# Patient Record
Sex: Male | Born: 1975 | Race: Black or African American | Hispanic: No | Marital: Married | Smoking: Current every day smoker
Health system: Southern US, Community
[De-identification: ages and names within clinical notes are randomized; demographics above are authoritative.]

---

## 2004-05-01 ENCOUNTER — Emergency Department (HOSPITAL_COMMUNITY): Admission: EM | Admit: 2004-05-01 | Discharge: 2004-05-02 | Payer: Self-pay | Admitting: *Deleted

## 2005-04-13 IMAGING — CR DG SHOULDER 2+V*R*
3 series · 3 of 3 positions shown · non-contrast
Comparison: none

CLINICAL DATA: Injury. 
 RIGHT SHOULDER TWO VIEWS ([DATE] HOURS)

[view not recorded (1 of 3)]
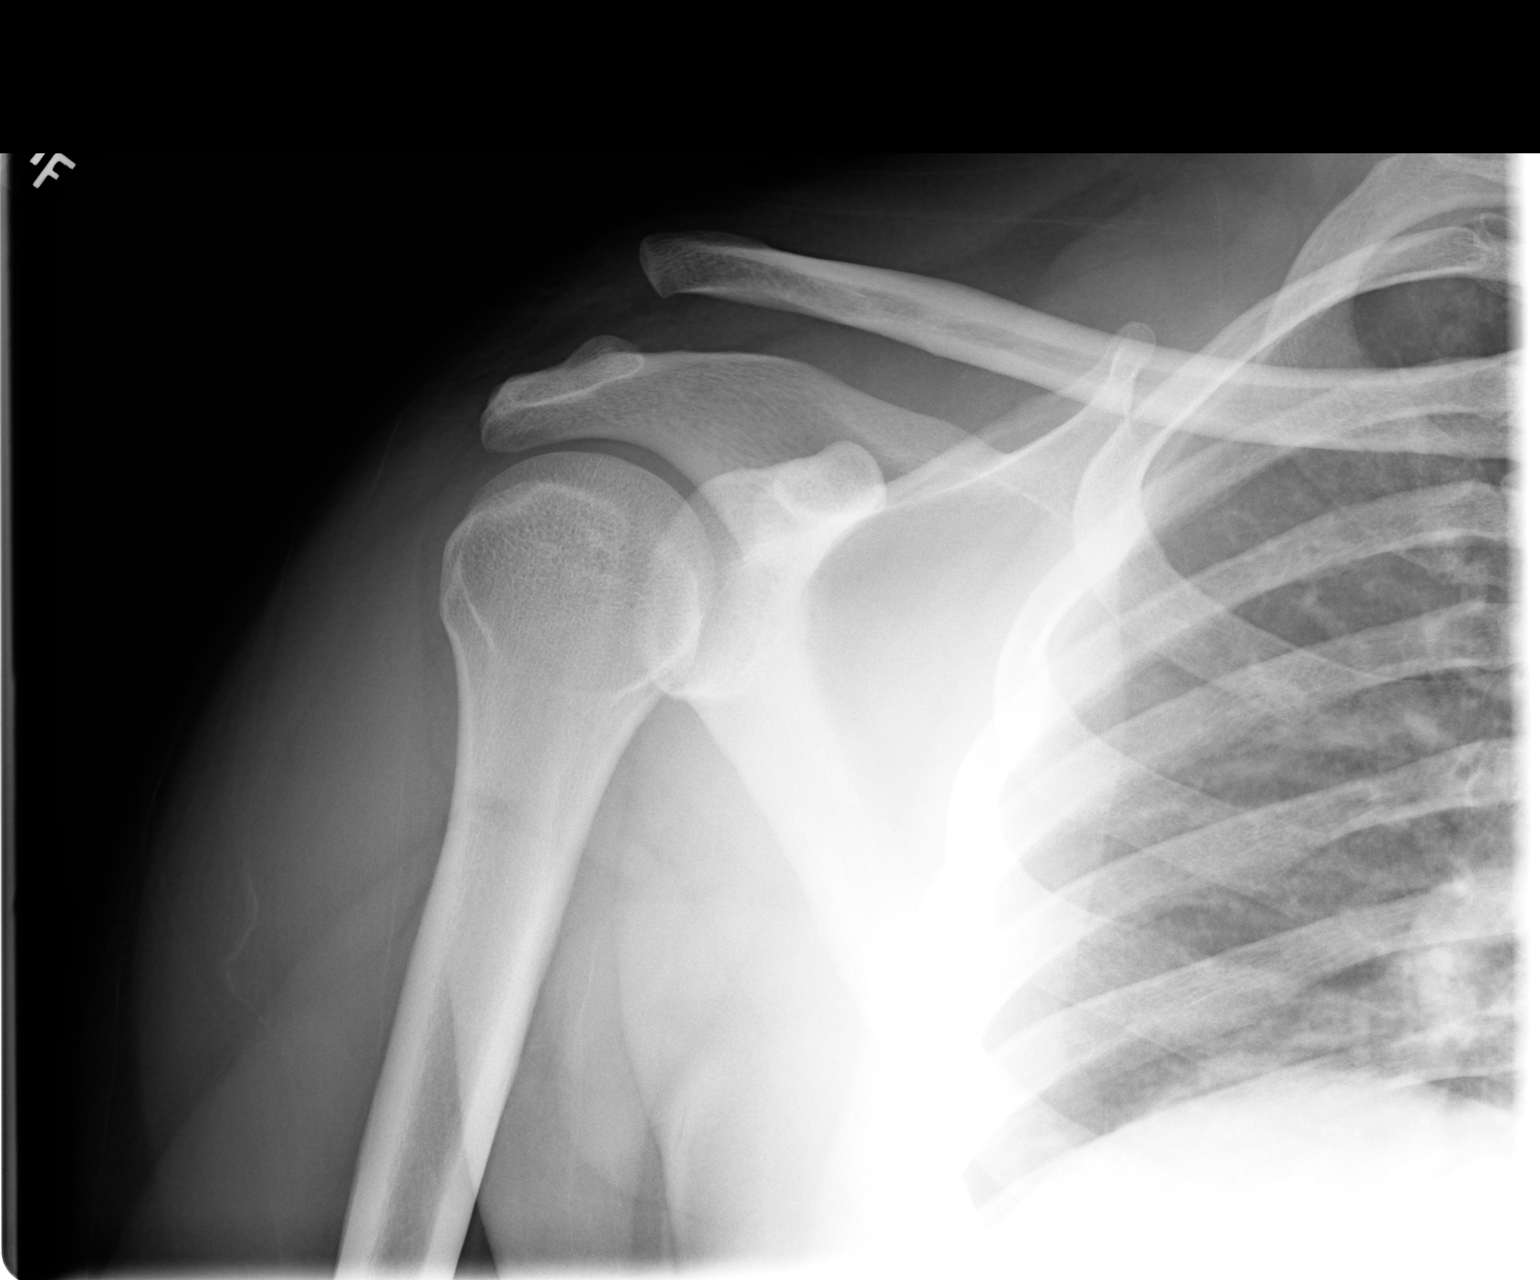

[view not recorded (2 of 3)]
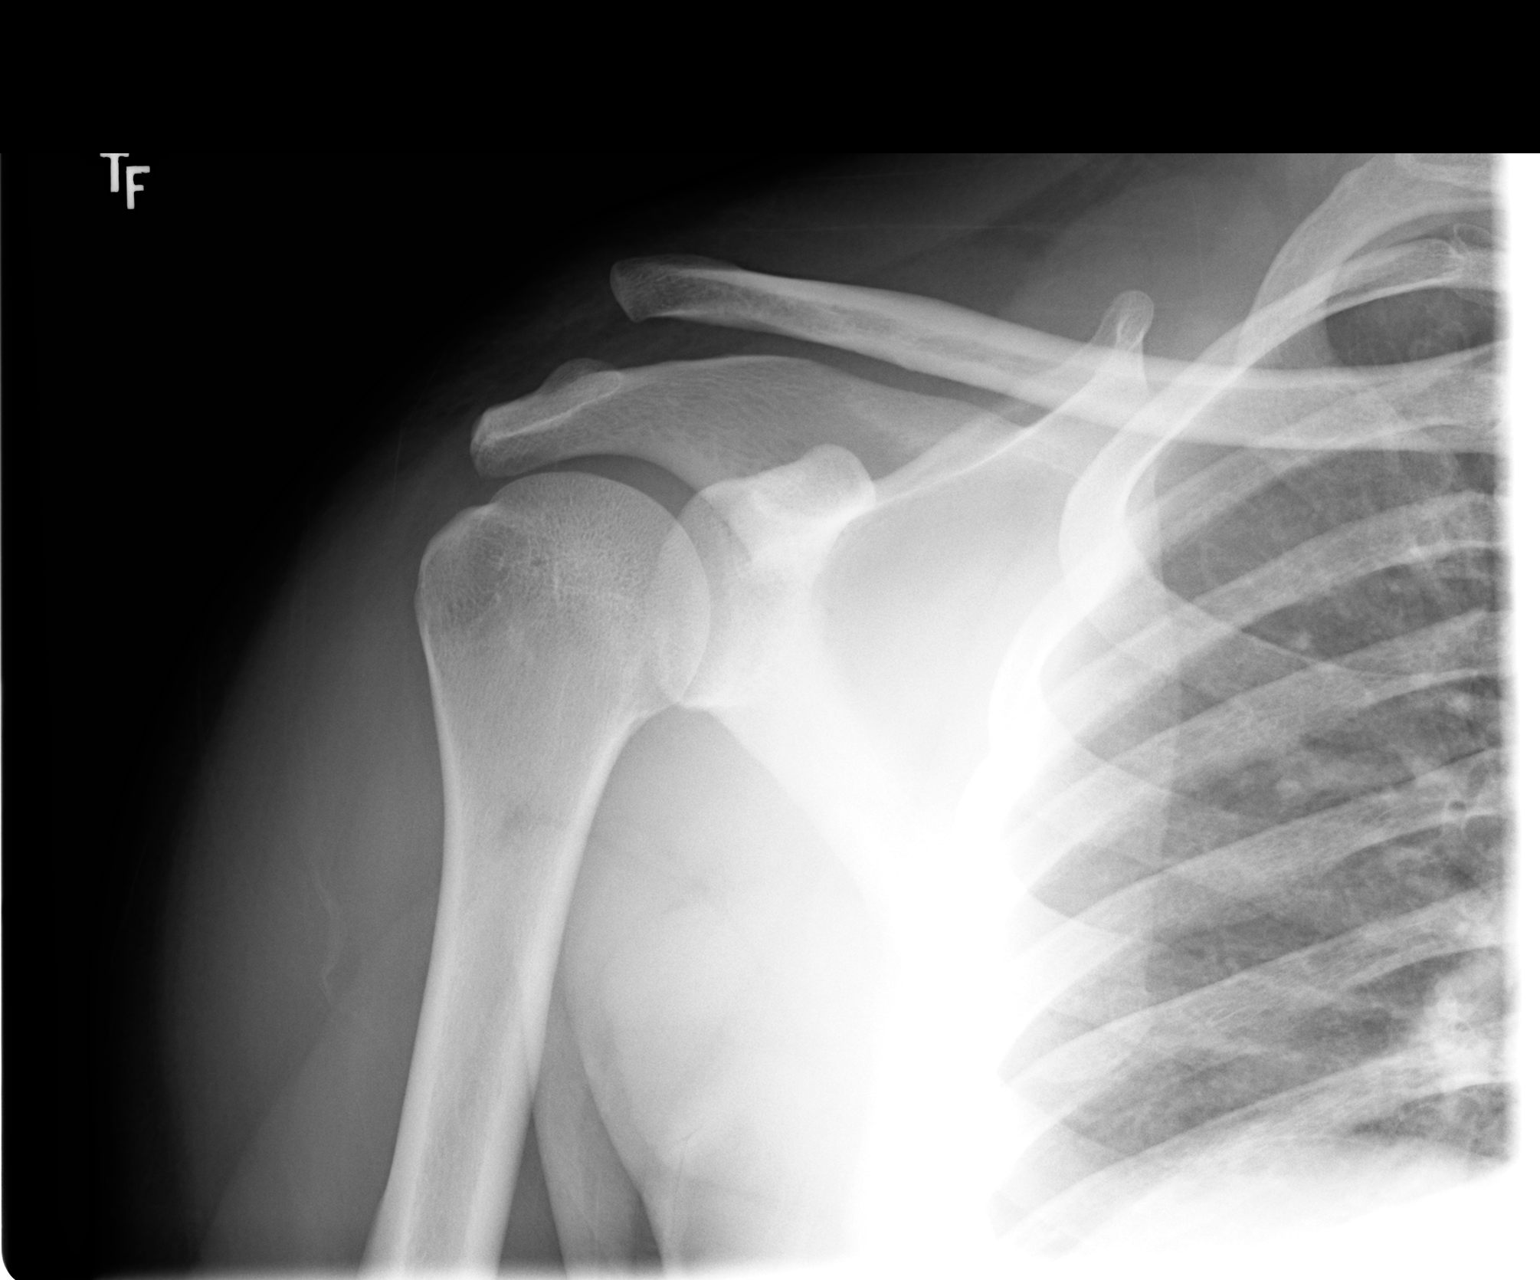

[view not recorded (3 of 3)]
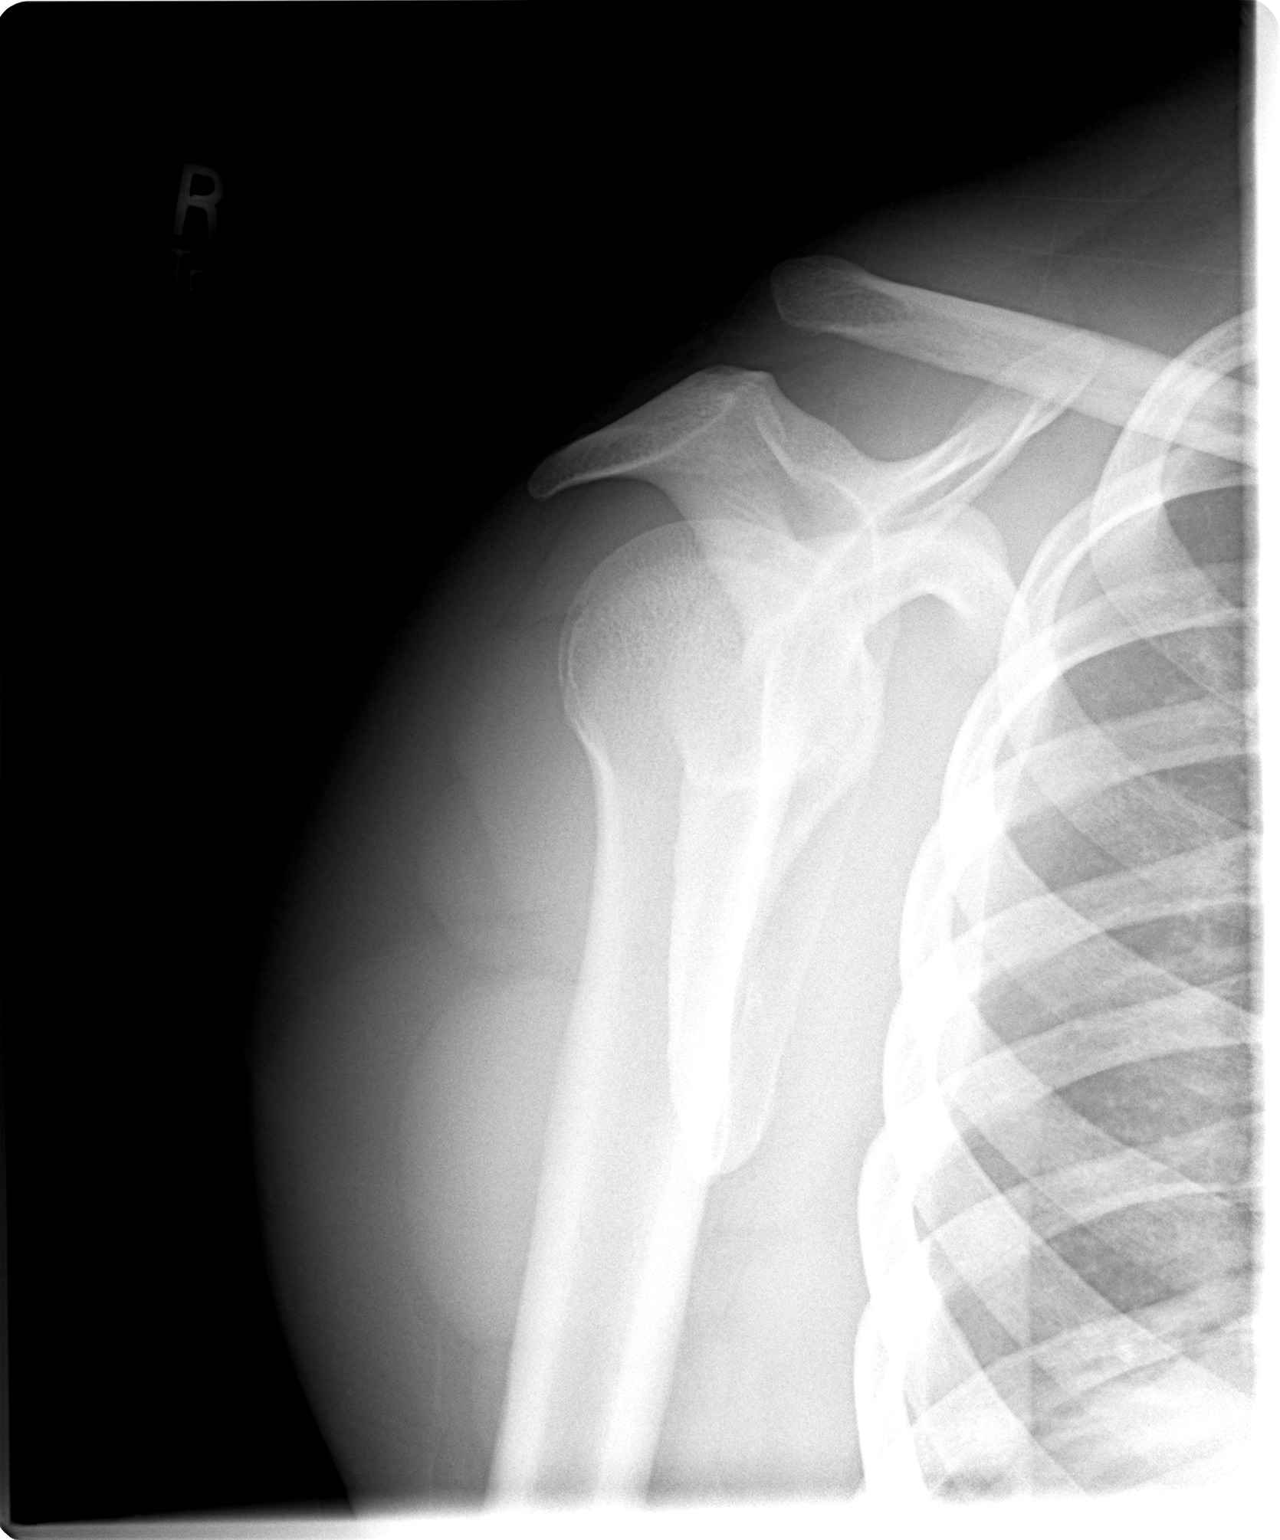

[3 of 3 positions shown; findings below may reference images not displayed]

FINDINGS: The clavicle is displaced significantly superiorly with respect to the acromion consistent with injury of the AC joint.  There is also abnormally increased distance between the clavicle and coracoid, again suggesting injury of the coracoclavicular ligament.  No fractures are seen. 
 IMPRESSION
 Injuries of the AC joint and coracoclavicular ligament as described.

## 2008-03-28 ENCOUNTER — Emergency Department: Payer: Self-pay | Admitting: Emergency Medicine

## 2013-04-09 ENCOUNTER — Encounter: Payer: Self-pay | Admitting: Physician Assistant

## 2013-04-09 NOTE — Progress Notes (Signed)
This encounter was created in error - please disregard.

## 2013-05-08 ENCOUNTER — Ambulatory Visit (INDEPENDENT_AMBULATORY_CARE_PROVIDER_SITE_OTHER): Payer: 59 | Admitting: Internal Medicine

## 2013-05-08 VITALS — BP 132/84 | HR 72 | Temp 99.2°F | Resp 16 | Ht 69.0 in | Wt 210.0 lb

## 2013-05-08 DIAGNOSIS — R21 Rash and other nonspecific skin eruption: Secondary | ICD-10-CM

## 2013-05-08 DIAGNOSIS — H01009 Unspecified blepharitis unspecified eye, unspecified eyelid: Secondary | ICD-10-CM

## 2013-05-08 DIAGNOSIS — D179 Benign lipomatous neoplasm, unspecified: Secondary | ICD-10-CM

## 2013-05-08 DIAGNOSIS — H01005 Unspecified blepharitis left lower eyelid: Secondary | ICD-10-CM

## 2013-05-08 LAB — HIV ANTIBODY (ROUTINE TESTING W REFLEX): HIV: NONREACTIVE

## 2013-05-08 LAB — POCT CBC
Granulocyte percent: 57.2 %G (ref 37–80)
Lymph, poc: 1.7 (ref 0.6–3.4)
MCH, POC: 32.5 pg — AB (ref 27–31.2)
MCHC: 31.9 g/dL (ref 31.8–35.4)
MCV: 101.8 fL — AB (ref 80–97)
MPV: 9.2 fL (ref 0–99.8)
POC LYMPH PERCENT: 34.6 %L (ref 10–50)
Platelet Count, POC: 321 10*3/uL (ref 142–424)
WBC: 4.9 10*3/uL (ref 4.6–10.2)

## 2013-05-08 LAB — COMPREHENSIVE METABOLIC PANEL
ALT: 23 U/L (ref 0–53)
Albumin: 4.1 g/dL (ref 3.5–5.2)
Chloride: 108 mEq/L (ref 96–112)
Potassium: 4.7 mEq/L (ref 3.5–5.3)
Total Bilirubin: 0.4 mg/dL (ref 0.3–1.2)
Total Protein: 7 g/dL (ref 6.0–8.3)

## 2013-05-08 LAB — GLUCOSE, POCT (MANUAL RESULT ENTRY): POC Glucose: 91 mg/dl (ref 70–99)

## 2013-05-08 LAB — POCT SKIN KOH: Skin KOH, POC: NEGATIVE

## 2013-05-08 MED ORDER — ECONAZOLE NITRATE 1 % EX CREA: TOPICAL_CREAM | Freq: Two times a day (BID) | CUTANEOUS | Status: AC

## 2013-05-08 MED ORDER — TERBINAFINE HCL 250 MG PO TABS: 250.0000 mg | ORAL_TABLET | Freq: Every day | ORAL | Status: AC

## 2013-05-08 MED ORDER — DOXYCYCLINE HYCLATE 100 MG PO CAPS: 100.0000 mg | ORAL_CAPSULE | Freq: Two times a day (BID) | ORAL | Status: AC

## 2013-05-08 MED ORDER — ERYTHROMYCIN 5 MG/GM OP OINT: TOPICAL_OINTMENT | OPHTHALMIC | Status: AC

## 2013-05-08 NOTE — Progress Notes (Signed)
  Subjective:    Patient ID: Randall Wolf, male    DOB: [birth year], 37 y.o.   MRN: [id number]  HPI Healthy appearing 37 yo male, co lumps under skin all over trunk, present for years and getting worse.Marland Kitchen Also has scaly itch rash on scalp spreading,. Also has eyelid blepharitis on left lid.. General health unknown. He feels good.   Review of Systems None, married     Objective:   Physical Exam  Vitals reviewed. Constitutional: He is oriented to person, place, and time. He appears well-developed and well-nourished. No distress.  HENT:  Head:    Eyes: EOM are normal. Pupils are equal, round, and reactive to light. Right eye exhibits discharge and exudate. Left eye exhibits discharge. Right conjunctiva is not injected. Right conjunctiva has no hemorrhage. Left conjunctiva is injected. Left conjunctiva has no hemorrhage.    Neck: Normal range of motion.  Cardiovascular: Normal rate and normal heart sounds.   Pulmonary/Chest: Effort normal and breath sounds normal.  Abdominal: He exhibits no mass.  Musculoskeletal: Normal range of motion.  Neurological: He is alert and oriented to person, place, and time. No cranial nerve deficit. He exhibits normal muscle tone. Coordination normal.  Skin: Rash noted.  Psychiatric: He has a normal mood and affect. His behavior is normal.    Plaque, red,scaly enlging circular.       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

## 2013-05-08 NOTE — Patient Instructions (Addendum)
Lipoma A lipoma is a noncancerous (benign) tumor composed of fat cells. They are usually found under the skin (subcutaneous). A lipoma may occur in any tissue of the body that contains fat. Common areas for lipomas to appear include the back, shoulders, buttocks, and thighs. Lipomas are a very common soft tissue growth. They are soft and grow slowly. Most problems caused by a lipoma depend on where it is growing. DIAGNOSIS  A lipoma can be diagnosed with a physical exam. These tumors rarely become cancerous, but radiographic studies can help determine this for certain. Studies used may include:  Computerized X-ray scans (CT or CAT scan).  Computerized magnetic scans (MRI). TREATMENT  Small lipomas that are not causing problems may be watched. If a lipoma continues to enlarge or causes problems, removal is often the best treatment. Lipomas can also be removed to improve appearance. Surgery is done to remove the fatty cells and the surrounding capsule. Most often, this is done with medicine that numbs the area (local anesthetic). The removed tissue is examined under a microscope to make sure it is not cancerous. Keep all follow-up appointments with your caregiver. SEEK MEDICAL CARE IF:   The lipoma becomes larger or hard.  The lipoma becomes painful, red, or increasingly swollen. These could be signs of infection or a more serious condition. Document Released: 08/25/2002 Document Revised: 11/27/2011 Document Reviewed: 02/04/2010 [facility] Patient Information 2014 [place], [place]. Body Ringworm Ringworm (tinea corporis) is a fungal infection of the skin on the body. This infection is not caused by worms, but is actually caused by a fungus. Fungus normally lives on the top of your skin and can be useful. However, in the case of ringworms, the fungus grows out of control and causes a skin infection. It can involve any area of skin on the body and can spread easily from one person to another (contagious).  Ringworm is a common problem for children, but it can affect adults as well. Ringworm is also often found in athletes, especially wrestlers who share equipment and mats.  CAUSES  Ringworm of the body is caused by a fungus called dermatophyte. It can spread by:  Touchingother people who are infected.  Touchinginfected pets.  Touching or sharingobjects that have been in contact with the infected person or pet (hats, combs, towels, clothing, sports equipment). SYMPTOMS   Itchy, raised red spots and bumps on the skin.  Ring-shaped rash.  Redness near the border of the rash with a clear center.  Dry and scaly skin on or around the rash. Not every person develops a ring-shaped rash. Some develop only the red, scaly patches. DIAGNOSIS  Most often, ringworm can be diagnosed by performing a skin exam. Your caregiver may choose to take a skin scraping from the affected area. The sample will be examined under the microscope to see if the fungus is present.  TREATMENT  Body ringworm may be treated with a topical antifungal cream or ointment. Sometimes, an antifungal shampoo that can be used on your body is prescribed. You may be prescribed antifungal medicines to take by mouth if your ringworm is severe, keeps coming back, or lasts a long time.  HOME CARE INSTRUCTIONS   Only take over-the-counter or prescription medicines as directed by your caregiver.  Wash the infected area and dry it completely before applying yourcream or ointment.  When using antifungal shampoo to treat the ringworm, leave the shampoo on the body for 3 5 minutes before rinsing.   Wear loose clothing to  stop clothes from rubbing and irritating the rash.  Wash or change your bed sheets every night while you have the rash.  Have your pet treated by your veterinarian if it has the same infection. To prevent ringworm:   Practice good hygiene.  Wear sandals or shoes in public places and showers.  Do not share  personal items with others.  Avoid touching red patches of skin on other people.  Avoid touching pets that have bald spots or wash your hands after doing so. SEEK MEDICAL CARE IF:   Your rash continues to spread after 7 days of treatment.  Your rash is not gone in 4 weeks.  The area around your rash becomes red, warm, tender, and swollen. Document Released: 09/01/2000 Document Revised: 05/29/2012 Document Reviewed: 03/18/2012 [facility] Patient Information 2014 [place], [place]. Blepharitis Blepharitis is redness, soreness, and swelling (inflammation) of one or both eyelids. It may be caused by an allergic reaction or a bacterial infection. Blepharitis may also be associated with reddened, scaly skin (seborrhea) of the scalp and eyebrows. While you sleep, eye discharge may cause your eyelashes to stick together. Your eyelids may itch, burn, swell, and may lose their lashes. These will grow back. Your eyes may become sensitive. Blepharitis may recur and need repeated treatment. If this is the case, you may require further evaluation by an eye specialist (ophthalmologist). HOME CARE INSTRUCTIONS   Keep your hands clean.  Use a clean towel each time you dry your eyelids. Do not use this towel to clean other areas. Do not share a towel or makeup with anyone.  Wash your eyelids with warm water or warm water mixed with a small amount of baby shampoo. Do this twice a day or as often as needed.  Wash your face and eyebrows at least once a day.  Use warm compresses 2 times a day for 10 minutes at a time, or as directed by your caregiver.  Apply antibiotic ointment as directed by your caregiver.  Avoid rubbing your eyes.  Avoid wearing makeup until you get better.  Follow up with your caregiver as directed. SEEK IMMEDIATE MEDICAL CARE IF:   You have pain, redness, or swelling that gets worse or spreads to other parts of your face.  Your vision changes, or you have pain when looking at  lights or moving objects.  You have a fever.  Your symptoms continue for longer than 2 to 4 days or become worse. MAKE SURE YOU:   Understand these instructions.  Will watch your condition.  Will get help right away if you are not doing well or get worse. Document Released: 09/01/2000 Document Revised: 11/27/2011 Document Reviewed: 10/12/2010 [facility] Patient Information 2014 [place], [place].

## 2013-05-10 ENCOUNTER — Encounter: Payer: Self-pay | Admitting: Family Medicine

## 2013-06-03 ENCOUNTER — Other Ambulatory Visit: Payer: Self-pay | Admitting: Physician Assistant

## 2014-12-30 ENCOUNTER — Other Ambulatory Visit: Payer: Self-pay

## 2014-12-30 ENCOUNTER — Emergency Department (HOSPITAL_COMMUNITY): Payer: Self-pay

## 2014-12-30 ENCOUNTER — Emergency Department (HOSPITAL_COMMUNITY)
Admission: EM | Admit: 2014-12-30 | Discharge: 2014-12-30 | Disposition: A | Discharge status: Home / Self Care | Payer: Self-pay | Attending: Emergency Medicine | Admitting: Emergency Medicine

## 2014-12-30 ENCOUNTER — Encounter (HOSPITAL_COMMUNITY): Payer: Self-pay | Admitting: Emergency Medicine

## 2014-12-30 DIAGNOSIS — Y9389 Activity, other specified: Secondary | ICD-10-CM | POA: Insufficient documentation

## 2014-12-30 DIAGNOSIS — Y998 Other external cause status: Secondary | ICD-10-CM | POA: Insufficient documentation

## 2014-12-30 DIAGNOSIS — Z720 Tobacco use: Secondary | ICD-10-CM | POA: Insufficient documentation

## 2014-12-30 DIAGNOSIS — Z792 Long term (current) use of antibiotics: Secondary | ICD-10-CM | POA: Insufficient documentation

## 2014-12-30 DIAGNOSIS — W01198A Fall on same level from slipping, tripping and stumbling with subsequent striking against other object, initial encounter: Secondary | ICD-10-CM | POA: Insufficient documentation

## 2014-12-30 DIAGNOSIS — R55 Syncope and collapse: Secondary | ICD-10-CM

## 2014-12-30 DIAGNOSIS — S0990XA Unspecified injury of head, initial encounter: Secondary | ICD-10-CM | POA: Insufficient documentation

## 2014-12-30 DIAGNOSIS — Y9289 Other specified places as the place of occurrence of the external cause: Secondary | ICD-10-CM | POA: Insufficient documentation

## 2014-12-30 DIAGNOSIS — S0101XA Laceration without foreign body of scalp, initial encounter: Secondary | ICD-10-CM | POA: Insufficient documentation

## 2014-12-30 LAB — COMPREHENSIVE METABOLIC PANEL
ALT: 21 U/L (ref 0–53)
AST: 29 U/L (ref 0–37)
Albumin: 3.5 g/dL (ref 3.5–5.2)
Alkaline Phosphatase: 64 U/L (ref 39–117)
Anion gap: 9 (ref 5–15)
BILIRUBIN TOTAL: 0.4 mg/dL (ref 0.3–1.2)
BUN: 10 mg/dL (ref 6–23)
CHLORIDE: 108 mmol/L (ref 96–112)
CO2: 23 mmol/L (ref 19–32)
Calcium: 8.3 mg/dL — ABNORMAL LOW (ref 8.4–10.5)
Creatinine, Ser: 1.11 mg/dL (ref 0.50–1.35)
GFR, EST NON AFRICAN AMERICAN: 83 mL/min — AB (ref >90)
GLUCOSE: 89 mg/dL (ref 70–99)
Potassium: 3.8 mmol/L (ref 3.5–5.1)
SODIUM: 140 mmol/L (ref 135–145)
Total Protein: 6.7 g/dL (ref 6.0–8.3)

## 2014-12-30 LAB — CBC WITH DIFFERENTIAL/PLATELET
BASOS PCT: 0 % (ref 0–1)
Basophils Absolute: 0 10*3/uL (ref 0.0–0.1)
EOS ABS: 0.1 10*3/uL (ref 0.0–0.7)
EOS PCT: 2 % (ref 0–5)
HEMATOCRIT: 37.8 % — AB (ref 39.0–52.0)
Hemoglobin: 12.7 g/dL — ABNORMAL LOW (ref 13.0–17.0)
LYMPHS ABS: 1.6 10*3/uL (ref 0.7–4.0)
Lymphocytes Relative: 35 % (ref 12–46)
MCH: 32.5 pg (ref 26.0–34.0)
MCHC: 33.6 g/dL (ref 30.0–36.0)
MCV: 96.7 fL (ref 78.0–100.0)
MONO ABS: 0.4 10*3/uL (ref 0.1–1.0)
MONOS PCT: 8 % (ref 3–12)
Neutro Abs: 2.6 10*3/uL (ref 1.7–7.7)
Neutrophils Relative %: 55 % (ref 43–77)
PLATELETS: 321 10*3/uL (ref 150–400)
RBC: 3.91 MIL/uL — AB (ref 4.22–5.81)
RDW: 14 % (ref 11.5–15.5)
WBC: 4.7 10*3/uL (ref 4.0–10.5)

## 2014-12-30 LAB — ETHANOL: Alcohol, Ethyl (B): 34 mg/dL — ABNORMAL HIGH (ref 0–9)

## 2014-12-30 MED ORDER — LIDOCAINE-EPINEPHRINE (PF) 1 %-1:200000 IJ SOLN
INTRAMUSCULAR | Status: AC
  Filled 2014-12-30: qty 10

## 2014-12-30 MED ORDER — POVIDONE-IODINE 10 % EX SOLN
CUTANEOUS | Status: AC
  Filled 2014-12-30: qty 118

## 2014-12-30 MED ORDER — BACITRACIN ZINC 500 UNIT/GM EX OINT
TOPICAL_OINTMENT | CUTANEOUS | Status: AC
  Filled 2014-12-30: qty 0.9

## 2014-12-30 MED ORDER — SODIUM CHLORIDE 0.9 % IV BOLUS (SEPSIS)
2000.0000 mL | Freq: Once | INTRAVENOUS | Status: AC
  Administered 2014-12-30: 2000 mL via INTRAVENOUS

## 2014-12-30 NOTE — ED Provider Notes (Signed)
CSN: [id number]     Arrival date & time 12/30/14  0200 History   First MD Initiated Contact with Patient 12/30/14 [phone]     Chief Complaint  Patient presents with  . Loss of Consciousness     (Consider location/radiation/quality/duration/timing/severity/associated sxs/prior Treatment) HPI Patient states he's been doing yard work yesterday and drinking several beers. He stood up from a sitting position and fell backward. He is amnestic to the event. Questionable loss of consciousness. Struck the back of his head on a bedpost. No seizure-like movements per wife. Patient with mild posterior headache but denies neck pain. No focal weakness or numbness. No history of syncope in the past. Denies chest pain or shortness of breath. Last tetanus within 4 months. History reviewed. No pertinent past medical history. History reviewed. No pertinent past surgical history. Family History  Problem Relation Age of Onset  . Diabetes Mother   . Multiple sclerosis Father   . Multiple sclerosis Sister   . Heart disease Maternal Grandmother   . Diabetes Maternal Grandmother   . Cancer Maternal Grandfather   . Stroke Paternal Grandfather    History  Substance Use Topics  . Smoking status: Current Every Day Smoker    Types: Cigarettes  . Smokeless tobacco: Not on file  . Alcohol Use: Yes    Review of Systems  Constitutional: Negative for fever and chills.  Eyes: Negative for visual disturbance.  Respiratory: Negative for shortness of breath.   Cardiovascular: Negative for chest pain.  Gastrointestinal: Negative for nausea, vomiting, abdominal pain and diarrhea.  Musculoskeletal: Negative for back pain, neck pain and neck stiffness.  Skin: Positive for wound.  Neurological: Positive for syncope and headaches. Negative for dizziness, weakness, light-headedness and numbness.  All other systems reviewed and are negative.     Allergies  Review of patient's allergies indicates no known  allergies.  Home Medications   Prior to Admission medications   Medication Sig Start Date End Date Taking? Authorizing Provider  doxycycline (VIBRAMYCIN) 100 MG capsule Take 1 capsule (100 mg total) by mouth 2 (two) times daily. 05/08/13   Orma Flaming, MD  econazole nitrate 1 % cream Apply topically 2 (two) times daily. 05/08/13   Orma Flaming, MD  erythromycin ophthalmic ointment Apply to affected eye BID after soap and water cleansing. 05/08/13   Orma Flaming, MD  terbinafine (LAMISIL) 250 MG tablet Take 1 tablet (250 mg total) by mouth daily. 05/08/13   Orma Flaming, MD   BP 129/97 mmHg  Pulse 96  Temp(Src) 98.2 F (36.8 C)  Resp 20  SpO2 98% Physical Exam  Constitutional: He is oriented to person, place, and time. He appears well-developed and well-nourished. No distress.  HENT:  Head: Normocephalic and atraumatic.  Mouth/Throat: Oropharynx is clear and moist.  Linear full-thickness laceration to the posterior scalp roughly 10 cm in length. No active bleeding. Not grossly contaminated. No underlying bony deformity.  Eyes: EOM are normal. Pupils are equal, round, and reactive to light.  Neck: Normal range of motion. Neck supple.  No posterior midline cervical tenderness to palpation.  Cardiovascular: Normal rate and regular rhythm.   Pulmonary/Chest: Effort normal and breath sounds normal. No respiratory distress. He has no wheezes. He has no rales. He exhibits no tenderness.  Abdominal: Soft. Bowel sounds are normal. He exhibits no distension and no mass. There is no tenderness. There is no rebound and no guarding.  Musculoskeletal: Normal range of motion. He exhibits no edema or tenderness.  Neurological: He is alert and oriented to person, place, and time.  Patient is alert and oriented x3 with clear, goal oriented speech. Patient has 5/5 motor in all extremities. Sensation is intact to light touch. Bilateral finger-to-nose is normal with no signs of dysmetria. Patient has a  normal gait and walks without assistance.  Skin: Skin is warm and dry. No rash noted. No erythema.  Psychiatric: He has a normal mood and affect. His behavior is normal.  Nursing note and vitals reviewed.   ED Course  LACERATION REPAIR Date/Time: 12/30/2014 5:00 AM Performed by: Julianne Rice Authorized by: Lita Mains, Shiloh Southern Patient identity confirmed: verbally with patient Body area: head/neck Location details: scalp Laceration length: 10 cm Foreign bodies: no foreign bodies Tendon involvement: none Nerve involvement: none Vascular damage: no Local anesthetic: lidocaine 1% with epinephrine Anesthetic total: 5 ml Preparation: Patient was prepped and draped in the usual sterile fashion. Irrigation solution: saline Irrigation method: syringe Amount of cleaning: standard Debridement: none Degree of undermining: none Skin closure: 4-0 Prolene Number of sutures: 10 Technique: simple Approximation: close Approximation difficulty: simple Dressing: antibiotic ointment and 4x4 sterile gauze Patient tolerance: Patient tolerated the procedure well with no immediate complications   (including critical care time) Labs Review Labs Reviewed  CBC WITH DIFFERENTIAL/PLATELET - Abnormal; Notable for the following:    RBC 3.91 (*)    Hemoglobin 12.7 (*)    HCT 37.8 (*)    All other components within normal limits  COMPREHENSIVE METABOLIC PANEL - Abnormal; Notable for the following:    Calcium 8.3 (*)    GFR calc non Af Amer 83 (*)    All other components within normal limits  ETHANOL - Abnormal; Notable for the following:    Alcohol, Ethyl (B) 34 (*)    All other components within normal limits    Imaging Review Ct Head Wo Contrast  12/30/2014   CLINICAL DATA:  Syncope with posterior head laceration.  EXAM: CT HEAD WITHOUT CONTRAST  TECHNIQUE: Contiguous axial images were obtained from the base of the skull through the vertex without intravenous contrast.  COMPARISON:  None.   FINDINGS: Skull and Sinuses:Posterior scalp laceration near the vertex. There is no underlying fracture or opaque foreign body.  Mild inflammatory mucosal thickening in the left paranasal sinuses. No sinus effusion.  Orbits: No acute abnormality.  Brain: No evidence of acute infarction, hemorrhage, hydrocephalus, or mass lesion/mass effect. Incidental cavum septum pellucidum.  IMPRESSION: 1. Negative intracranial imaging. 2. Posterior scalp laceration without fracture.   Electronically Signed   By: Monte Fantasia M.D.   On: 12/30/2014 04:12     EKG Interpretation   Date/Time:  Wednesday December 30 2014 02:57:19 EDT Ventricular Rate:  88 PR Interval:  191 QRS Duration: 88 QT Interval:  345 QTC Calculation: 417 R Axis:   63 Text Interpretation:  Sinus rhythm Nonspecific T abnormalities, lateral  leads Minimal ST elevation, anterior leads Confirmed by Lucerito Rosinski  MD,  Dredyn Gubbels ([id number]) on 12/30/2014 5:03:07 AM      MDM   Final diagnoses:  Syncope and collapse  Scalp laceration, initial encounter  Closed head injury, initial encounter    Increased heart rate when going from sitting to standing. Believe dehydration may have contributed to the patient's fall. CT head without any intracranial findings. Patient been given head injury precautions. Patient's lacerations been repaired in the emergency department. He is advised to have sutures removed in 7 days. Advised to drink plenty of fluids and change positions slowly.  Julianne Rice, MD 12/30/14 [phone]

## 2014-12-30 NOTE — ED Notes (Signed)
Pt states he was having coughing spell and stood up. Wife then states he fell backwards striking head.

## 2014-12-30 NOTE — Discharge Instructions (Signed)
Return to the emergency department or your primary physician in 7 days to have her sutures removed. Return immediately for any evidence of infection such as redness, swelling, warmth or purulent discharge. You may take Tylenol and ibuprofen for pain.  Concussion A concussion, or closed-head injury, is a brain injury caused by a direct blow to the head or by a quick and sudden movement (jolt) of the head or neck. Concussions are usually not life-threatening. Even so, the effects of a concussion can be serious. If you have had a concussion before, you are more likely to experience concussion-like symptoms after a direct blow to the head.  CAUSES  Direct blow to the head, such as from running into another player during a soccer game, being hit in a fight, or hitting your head on a hard surface.  A jolt of the head or neck that causes the brain to move back and forth inside the skull, such as in a car crash. SIGNS AND SYMPTOMS The signs of a concussion can be hard to notice. Early on, they may be missed by you, family members, and health care providers. You may look fine but act or feel differently. Symptoms are usually temporary, but they may last for days, weeks, or even longer. Some symptoms may appear right away while others may not show up for hours or days. Every head injury is different. Symptoms include:  Mild to moderate headaches that will not go away.  A feeling of pressure inside your head.  Having more trouble than usual:  Learning or remembering things you have heard.  Answering questions.  Paying attention or concentrating.  Organizing daily tasks.  Making decisions and solving problems.  Slowness in thinking, acting or reacting, speaking, or reading.  Getting lost or being easily confused.  Feeling tired all the time or lacking energy (fatigued).  Feeling drowsy.  Sleep disturbances.  Sleeping more than usual.  Sleeping less than usual.  Trouble falling  asleep.  Trouble sleeping (insomnia).  Loss of balance or feeling lightheaded or dizzy.  Nausea or vomiting.  Numbness or tingling.  Increased sensitivity to:  Sounds.  Lights.  Distractions.  Vision problems or eyes that tire easily.  Diminished sense of taste or smell.  Ringing in the ears.  Mood changes such as feeling sad or anxious.  Becoming easily irritated or angry for little or no reason.  Lack of motivation.  Seeing or hearing things other people do not see or hear (hallucinations). DIAGNOSIS Your health care provider can usually diagnose a concussion based on a description of your injury and symptoms. He or she will ask whether you passed out (lost consciousness) and whether you are having trouble remembering events that happened right before and during your injury. Your evaluation might include:  A brain scan to look for signs of injury to the brain. Even if the test shows no injury, you may still have a concussion.  Blood tests to be sure other problems are not present. TREATMENT  Concussions are usually treated in an emergency department, in urgent care, or at a clinic. You may need to stay in the hospital overnight for further treatment.  Tell your health care provider if you are taking any medicines, including prescription medicines, over-the-counter medicines, and natural remedies. Some medicines, such as blood thinners (anticoagulants) and aspirin, may increase the chance of complications. Also tell your health care provider whether you have had alcohol or are taking illegal drugs. This information may affect treatment.  Your  health care provider will send you home with important instructions to follow.  How fast you will recover from a concussion depends on many factors. These factors include how severe your concussion is, what part of your brain was injured, your age, and how healthy you were before the concussion.  Most people with mild injuries  recover fully. Recovery can take time. In general, recovery is slower in older persons. Also, persons who have had a concussion in the past or have other medical problems may find that it takes longer to recover from their current injury. HOME CARE INSTRUCTIONS General Instructions  Carefully follow the directions your health care provider gave you.  Only take over-the-counter or prescription medicines for pain, discomfort, or fever as directed by your health care provider.  Take only those medicines that your health care provider has approved.  Do not drink alcohol until your health care provider says you are well enough to do so. Alcohol and certain other drugs may slow your recovery and can put you at risk of further injury.  If it is harder than usual to remember things, write them down.  If you are easily distracted, try to do one thing at a time. For example, do not try to watch TV while fixing dinner.  Talk with family members or close friends when making important decisions.  Keep all follow-up appointments. Repeated evaluation of your symptoms is recommended for your recovery.  Watch your symptoms and tell others to do the same. Complications sometimes occur after a concussion. Older adults with a brain injury may have a higher risk of serious complications, such as a blood clot on the brain.  Tell your teachers, school nurse, school counselor, coach, athletic trainer, or work Freight forwarder about your injury, symptoms, and restrictions. Tell them about what you can or cannot do. They should watch for:  Increased problems with attention or concentration.  Increased difficulty remembering or learning new information.  Increased time needed to complete tasks or assignments.  Increased irritability or decreased ability to cope with stress.  Increased symptoms.  Rest. Rest helps the brain to heal. Make sure you:  Get plenty of sleep at night. Avoid staying up late at night.  Keep  the same bedtime hours on weekends and weekdays.  Rest during the day. Take daytime naps or rest breaks when you feel tired.  Limit activities that require a lot of thought or concentration. These include:  Doing homework or job-related work.  Watching TV.  Working on the computer.  Avoid any situation where there is potential for another head injury (football, hockey, soccer, basketball, martial arts, downhill snow sports and horseback riding). Your condition will get worse every time you experience a concussion. You should avoid these activities until you are evaluated by the appropriate follow-up health care providers. Returning To Your Regular Activities You will need to return to your normal activities slowly, not all at once. You must give your body and brain enough time for recovery.  Do not return to sports or other athletic activities until your health care provider tells you it is safe to do so.  Ask your health care provider when you can drive, ride a bicycle, or operate heavy machinery. Your ability to react may be slower after a brain injury. Never do these activities if you are dizzy.  Ask your health care provider about when you can return to work or school. Preventing Another Concussion It is very important to avoid another brain injury, especially  before you have recovered. In rare cases, another injury can lead to permanent brain damage, brain swelling, or death. The risk of this is greatest during the first 7-10 days after a head injury. Avoid injuries by:  Wearing a seat belt when riding in a car.  Drinking alcohol only in moderation.  Wearing a helmet when biking, skiing, skateboarding, skating, or doing similar activities.  Avoiding activities that could lead to a second concussion, such as contact or recreational sports, until your health care provider says it is okay.  Taking safety measures in your home.  Remove clutter and tripping hazards from floors and  stairways.  Use grab bars in bathrooms and handrails by stairs.  Place non-slip mats on floors and in bathtubs.  Improve lighting in dim areas. SEEK MEDICAL CARE IF:  You have increased problems paying attention or concentrating.  You have increased difficulty remembering or learning new information.  You need more time to complete tasks or assignments than before.  You have increased irritability or decreased ability to cope with stress.  You have more symptoms than before. Seek medical care if you have any of the following symptoms for more than 2 weeks after your injury:  Lasting (chronic) headaches.  Dizziness or balance problems.  Nausea.  Vision problems.  Increased sensitivity to noise or light.  Depression or mood swings.  Anxiety or irritability.  Memory problems.  Difficulty concentrating or paying attention.  Sleep problems.  Feeling tired all the time. SEEK IMMEDIATE MEDICAL CARE IF:  You have severe or worsening headaches. These may be a sign of a blood clot in the brain.  You have weakness (even if only in one hand, leg, or part of the face).  You have numbness.  You have decreased coordination.  You vomit repeatedly.  You have increased sleepiness.  One pupil is larger than the other.  You have convulsions.  You have slurred speech.  You have increased confusion. This may be a sign of a blood clot in the brain.  You have increased restlessness, agitation, or irritability.  You are unable to recognize people or places.  You have neck pain.  It is difficult to wake you up.  You have unusual behavior changes.  You lose consciousness. MAKE SURE YOU:  Understand these instructions.  Will watch your condition.  Will get help right away if you are not doing well or get worse. Document Released: 11/25/2003 Document Revised: 09/09/2013 Document Reviewed: 03/27/2013 [facility] Patient Information 2015 [place], [place]. This  information is not intended to replace advice given to you by your health care provider. Make sure you discuss any questions you have with your health care provider.  Laceration Care, Adult A laceration is a cut or lesion that goes through all layers of the skin and into the tissue just beneath the skin. TREATMENT  Some lacerations may not require closure. Some lacerations may not be able to be closed due to an increased risk of infection. It is important to see your caregiver as soon as possible after an injury to minimize the risk of infection and maximize the opportunity for successful closure. If closure is appropriate, pain medicines may be given, if needed. The wound will be cleaned to help prevent infection. Your caregiver will use stitches (sutures), staples, wound glue (adhesive), or skin adhesive strips to repair the laceration. These tools bring the skin edges together to allow for faster healing and a better cosmetic outcome. However, all wounds will heal with a scar.  Once the wound has healed, scarring can be minimized by covering the wound with sunscreen during the day for 1 full year. HOME CARE INSTRUCTIONS  For sutures or staples:  Keep the wound clean and dry.  If you were given a bandage (dressing), you should change it at least once a day. Also, change the dressing if it becomes wet or dirty, or as directed by your caregiver.  Wash the wound with soap and water 2 times a day. Rinse the wound off with water to remove all soap. Pat the wound dry with a clean towel.  After cleaning, apply a thin layer of the antibiotic ointment as recommended by your caregiver. This will help prevent infection and keep the dressing from sticking.  You may shower as usual after the first 24 hours. Do not soak the wound in water until the sutures are removed.  Only take over-the-counter or prescription medicines for pain, discomfort, or fever as directed by your caregiver.  Get your sutures or  staples removed as directed by your caregiver. For skin adhesive strips:  Keep the wound clean and dry.  Do not get the skin adhesive strips wet. You may bathe carefully, using caution to keep the wound dry.  If the wound gets wet, pat it dry with a clean towel.  Skin adhesive strips will fall off on their own. You may trim the strips as the wound heals. Do not remove skin adhesive strips that are still stuck to the wound. They will fall off in time. For wound adhesive:  You may briefly wet your wound in the shower or bath. Do not soak or scrub the wound. Do not swim. Avoid periods of heavy perspiration until the skin adhesive has fallen off on its own. After showering or bathing, gently pat the wound dry with a clean towel.  Do not apply liquid medicine, cream medicine, or ointment medicine to your wound while the skin adhesive is in place. This may loosen the film before your wound is healed.  If a dressing is placed over the wound, be careful not to apply tape directly over the skin adhesive. This may cause the adhesive to be pulled off before the wound is healed.  Avoid prolonged exposure to sunlight or tanning lamps while the skin adhesive is in place. Exposure to ultraviolet light in the first year will darken the scar.  The skin adhesive will usually remain in place for 5 to 10 days, then naturally fall off the skin. Do not pick at the adhesive film. You may need a tetanus shot if:  You cannot remember when you had your last tetanus shot.  You have never had a tetanus shot. If you get a tetanus shot, your arm may swell, get red, and feel warm to the touch. This is common and not a problem. If you need a tetanus shot and you choose not to have one, there is a rare chance of getting tetanus. Sickness from tetanus can be serious. SEEK MEDICAL CARE IF:   You have redness, swelling, or increasing pain in the wound.  You see a red line that goes away from the wound.  You have  yellowish-white fluid (pus) coming from the wound.  You have a fever.  You notice a bad smell coming from the wound or dressing.  Your wound breaks open before or after sutures have been removed.  You notice something coming out of the wound such as wood or glass.  Your wound is on  your hand or foot and you cannot move a finger or toe. SEEK IMMEDIATE MEDICAL CARE IF:   Your pain is not controlled with prescribed medicine.  You have severe swelling around the wound causing pain and numbness or a change in color in your arm, hand, leg, or foot.  Your wound splits open and starts bleeding.  You have worsening numbness, weakness, or loss of function of any joint around or beyond the wound.  You develop painful lumps near the wound or on the skin anywhere on your body. MAKE SURE YOU:   Understand these instructions.  Will watch your condition.  Will get help right away if you are not doing well or get worse. Document Released: 09/04/2005 Document Revised: 11/27/2011 Document Reviewed: 02/28/2011 [facility] Patient Information 2015 [place], [place]. This information is not intended to replace advice given to you by your health care provider. Make sure you discuss any questions you have with your health care provider.

## 2019-07-30 ENCOUNTER — Other Ambulatory Visit: Payer: Self-pay

## 2019-07-30 DIAGNOSIS — Z20822 Contact with and (suspected) exposure to covid-19: Secondary | ICD-10-CM

## 2019-08-01 ENCOUNTER — Telehealth: Payer: Self-pay

## 2019-08-01 LAB — NOVEL CORONAVIRUS, NAA: SARS-CoV-2, NAA: NOT DETECTED

## 2019-08-01 NOTE — Telephone Encounter (Signed)
Patient called in requesting [place] lab results - DOB/Address verified - results pending. Reviewed testing process with patient. Assisted with MyChart setup, no further questions.
